# Patient Record
Sex: Male | Born: 1950 | Race: White | Hispanic: No | Smoking: Current every day smoker
Health system: Southern US, Community
[De-identification: ages and names within clinical notes are randomized; demographics above are authoritative.]

## PROBLEM LIST (undated history)

## (undated) DIAGNOSIS — I10 Essential (primary) hypertension: Secondary | ICD-10-CM

## (undated) DIAGNOSIS — E785 Hyperlipidemia, unspecified: Secondary | ICD-10-CM

## (undated) HISTORY — PX: APPENDECTOMY: SHX54

## (undated) HISTORY — DX: Hyperlipidemia, unspecified: E78.5

## (undated) HISTORY — DX: Essential (primary) hypertension: I10

---

## 1999-12-19 ENCOUNTER — Encounter: Admission: RE | Admit: 1999-12-19 | Discharge: 1999-12-19 | Payer: Self-pay

## 1999-12-23 ENCOUNTER — Ambulatory Visit (HOSPITAL_BASED_OUTPATIENT_CLINIC_OR_DEPARTMENT_OTHER): Admission: RE | Admit: 1999-12-23 | Discharge: 1999-12-23 | Payer: Self-pay

## 2000-01-20 ENCOUNTER — Ambulatory Visit (HOSPITAL_BASED_OUTPATIENT_CLINIC_OR_DEPARTMENT_OTHER): Admission: RE | Admit: 2000-01-20 | Discharge: 2000-01-20 | Payer: Self-pay

## 2006-01-18 ENCOUNTER — Ambulatory Visit: Payer: Self-pay

## 2020-07-03 ENCOUNTER — Encounter: Payer: Self-pay | Admitting: Sports Medicine

## 2020-07-03 ENCOUNTER — Ambulatory Visit (INDEPENDENT_AMBULATORY_CARE_PROVIDER_SITE_OTHER): Payer: Medicare Other

## 2020-07-03 ENCOUNTER — Ambulatory Visit (INDEPENDENT_AMBULATORY_CARE_PROVIDER_SITE_OTHER): Payer: Medicare Other | Admitting: Sports Medicine

## 2020-07-03 DIAGNOSIS — M5416 Radiculopathy, lumbar region: Secondary | ICD-10-CM

## 2020-07-03 MED ORDER — TRAMADOL HCL 50 MG PO TABS: 50.0000 mg | ORAL_TABLET | Freq: Three times a day (TID) | ORAL | 0 refills | Status: AC | PRN

## 2020-07-03 MED ORDER — MELOXICAM 15 MG PO TABS: ORAL_TABLET | ORAL | 3 refills | Status: DC

## 2020-07-03 NOTE — Assessment & Plan Note (Signed)
This is a very pleasant 69 year old male, for some time now he has had pain running from his left buttock, lateral knee, lateral lower leg, but not to the foot. Worse with sitting, flexion, Valsalva, playing golf. He has no red flag symptoms. We will start relatively conservatively, x-rays, meloxicam, tramadol to use before golf, formal physical therapy. We did discussed the evolutionary anthropology of lumbar disc disease. Return to see me in 6 weeks, MRI for interventional planning if no better.

## 2020-07-03 NOTE — Progress Notes (Signed)
    Procedures performed today:    None.  Independent interpretation of notes and tests performed by another provider:   None.  Brief History, Exam, Impression, and Recommendations:    Left lumbar radiculitis This is a very pleasant 69 year old male, for some time now he has had pain running from his left buttock, lateral knee, lateral lower leg, but not to the foot. Worse with sitting, flexion, Valsalva, playing golf. He has no red flag symptoms. We will start relatively conservatively, x-rays, meloxicam, tramadol to use before golf, formal physical therapy. We did discussed the evolutionary anthropology of lumbar disc disease. Return to see me in 6 weeks, MRI for interventional planning if no better.    ___________________________________________ Ihor Austin. Benjamin Stain, M.D., ABFM., CAQSM. Primary Care and Sports Medicine [facility]  Adjunct Instructor of Family Medicine  [employer]

## 2020-07-08 ENCOUNTER — Ambulatory Visit (INDEPENDENT_AMBULATORY_CARE_PROVIDER_SITE_OTHER): Payer: Medicare Other | Admitting: Rehabilitative and Restorative Service Providers"

## 2020-07-08 ENCOUNTER — Encounter: Payer: Self-pay | Admitting: Rehabilitative and Restorative Service Providers"

## 2020-07-08 ENCOUNTER — Other Ambulatory Visit: Payer: Self-pay

## 2020-07-08 DIAGNOSIS — M79605 Pain in left leg: Secondary | ICD-10-CM | POA: Diagnosis not present

## 2020-07-08 DIAGNOSIS — M5416 Radiculopathy, lumbar region: Secondary | ICD-10-CM

## 2020-07-08 DIAGNOSIS — R29898 Other symptoms and signs involving the musculoskeletal system: Secondary | ICD-10-CM

## 2020-07-08 NOTE — Therapy (Signed)
[facility] [address] [place], [place], [address] Phone: [phone]   Fax:  [phone]  Physical Therapy Evaluation  Patient Details  Name: Leonard Anderson MRN: [id number] Date of Birth: 11/25/1951 Referring Provider (PT): Dr Benjamin Stain    Encounter Date: 07/08/2020   PT End of Session - 07/08/20 1249    Visit Number 1    Number of Visits 6    Date for PT Re-Evaluation 08/19/20    PT Start Time 1146    PT Stop Time 1238    PT Time Calculation (min) 52 min    Activity Tolerance Patient tolerated treatment well           Past Medical History:  Diagnosis Date  . Hyperlipidemia   . Hypertension     Past Surgical History:  Procedure Laterality Date  . APPENDECTOMY      There were no vitals filed for this visit.    Subjective Assessment - 07/08/20 1154    Subjective Patient reports that he started having intermittent LBP/primarily Lt LE pain March 2021 with continued pain in the Lt LE limiting activities. Initial onset of LBP at 69 yo with some intermittent pain in the LB 1-2 times a year which resolved with rest and sometimes MD appt for muscle relaxants. Has always worked out and stayed active which has helped manage pain. He has changed activity level over the past year with COVID.    Pertinent History HTN; intermittent LBP    Patient Stated Goals golf; help daughter with business woodworking with some lifting    Currently in Pain? No/denies              [facility] PT Assessment - 07/08/20 0001      Assessment   Medical Diagnosis LBP with Lt LE radiculopathy     Referring Provider (PT) Dr Benjamin Stain     Onset Date/Surgical Date 02/26/20    Hand Dominance Right    Next MD Visit 08/14/20    Prior Therapy none       Precautions   Precautions None      Balance Screen   Has the patient fallen in the past 6 months No    Has the patient had a decrease in activity level because of a fear of falling?  No     Is the patient reluctant to leave their home because of a fear of falling?  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 some lifting up to 40-50 #; sometimes awkward lifts     Leisure houshohold chores; golfing; exercise at home ~ 2-3 times/wk cardio and resistive bands (gym has not reopened)        Observation/Other Assessments   Focus on Therapeutic Outcomes (FOTO)  37% limitation       Sensation   Additional Comments WFL's per pt report       Posture/Postural Control   Posture Comments head forwar; rounded posture; sits with trunk forward flexed      AROM   Lumbar Flexion 80%     Lumbar Extension 35% tightness     Lumbar - Right Side Bend 90%    Lumbar - Left Side Bend 85% pulling Lt lumbar     Lumbar - Right Rotation 40%    Lumbar - Left Rotation 35% tightness  Lt lumbar area       Strength   Overall Strength Comments WFL's bilat LE's       Flexibility   Hamstrings tight Lt > Rt     Quadriceps tight Rt > Lt     ITB tight Lt > Rt     Piriformis tight Rt > Lt       Palpation   Spinal mobility hypomobile lumbar spine with CPA mobs     Palpation comment muscular tightness Lt lumbar > Rt; Rt posterior hip       Special Tests   Other special tests (-) slump test; SLR; FABERS                       Objective measurements completed on examination: See above findings.       [facility] Adult PT Treatment/Exercise - 07/08/20 0001      Self-Care   Self-Care --   discussion/demo sitting postures     Lumbar Exercises: Stretches   Passive Hamstring Stretch Left;2 reps;30 seconds    Standing Extension 1 rep   2-3 sec    Prone on Elbows Stretch 1 rep;60 seconds    Press Ups 10 reps   2-3 sec - limited mobility    Gastroc Stretch Left;2 reps;30 seconds    Gastroc Stretch Limitations soleus stretch 30 sec x 2 reps                    PT Education - 07/08/20 1235    Education Details HEP posture and back care POC    Person(s) Educated Patient    Methods Explanation;Demonstration;Tactile cues;Verbal cues;Handout    Comprehension Verbalized understanding;Returned demonstration;Verbal cues required;Tactile cues required               PT Long Term Goals - 07/08/20 1300      PT LONG TERM GOAL #1   Title Increase spinal mobility with patient to demonstrate 50-60% turnk extension in prone    Time 6    Period Weeks    Status New    Target Date 08/19/20      PT LONG TERM GOAL #2   Title Patient will verbalize and demonstrate proper body mechanics with sitting; transfers; lifting    Time 6    Period Weeks    Status New    Target Date 08/19/20      PT LONG TERM GOAL #3   Title Patient reports resolution of all Lt LE pain with functional and recreational activities    Time 6    Period Weeks    Status New    Target Date 08/19/20      PT LONG TERM GOAL #4   Title Independent in HEP    Time 6    Period Weeks    Status New    Target Date 08/19/20      PT LONG TERM GOAL #5   Title Improve FOTO to </= 28% limitation    Time 6    Period Weeks    Status New                  Plan - 07/08/20 1249    Clinical Impression Statement Patient presents with history of intermittent Lt LE pain for the past 5-6 months. He has a history of episodic LBP over the past 50 years with symptoms resolving with rest and meds. Patient has limited trunk mobility and muscular tightness through the lumbar and  posterior hip; forward flexed posture and alignment; intermittent pain; limited functional and recreational tolerance. Patient will benefit from PT to address problems identified and return patient to prior level of function with return to regular exercise.    Stability/Clinical Decision Making Stable/Uncomplicated    Clinical Decision Making Low    Rehab Potential Good    PT Frequency 1x / week     PT Duration 6 weeks    PT Treatment/Interventions Patient/family education;ADLs/Self Care Home Management;Aquatic Therapy;Cryotherapy;Electrical Stimulation;Iontophoresis 4mg /ml Dexamethasone;Moist Heat;Traction;Ultrasound;Functional mobility training;Therapeutic activities;Therapeutic exercise;Balance training;Neuromuscular re-education;Manual techniques;Dry needling;Taping    PT Next Visit Plan review HEP; continue lumbar extension;  progress with pec stretch; posterior shoulder girdle TB strengthening including antirotation TB exercise; begin lifting program; education re - return to gym program    PT Home Exercise Plan [email or web address]    Consulted and Agree with Plan of Care Patient           Patient will benefit from skilled therapeutic intervention in order to improve the following deficits and impairments:  Decreased range of motion, Decreased mobility, Postural dysfunction, Pain, Decreased activity tolerance  Visit Diagnosis: Radiculopathy, lumbar region - Plan: PT plan of care cert/re-cert  Pain of left lower extremity - Plan: PT plan of care cert/re-cert  Other symptoms and signs involving the musculoskeletal system - Plan: PT plan of care cert/re-cert     Problem List Patient Active Problem List   Diagnosis Date Noted  . Left lumbar radiculitis 07/03/2020    Ruvim Risko 09/03/2020 PT, MPH  07/08/2020, 1:05 PM  [facility] [address] [place] [address] [place], [place], [place] Phone: [phone]   Fax:  [phone]  Name: Leonard Anderson MRN: Silva Bandy Date of Birth: April 05, 1951

## 2020-07-08 NOTE — Patient Instructions (Signed)
Access Code: [email or web address]: [email or web address]: 07/12/2021Prepared by: Domino Holten HoltExercises  Prone Press Up - 2 x daily - 7 x weekly - 1 sets - 10 reps - 2-3 sec hold  Prone Press Up on Elbows - 2 x daily - 7 x weekly - 1 sets - 3 reps - 1-2 min hold  Hooklying Hamstring Stretch with Strap - 2 x daily - 7 x weekly - 1 sets - 3 reps - 30 sec hold  Supine ITB Stretch with Strap - 2 x daily - 7 x weekly - 1 sets - 3 reps - 30 sec hold  Gastroc Stretch on Wall - 2 x daily - 7 x weekly - 1 sets - 3 reps - 30 sec hold  Soleus Stretch on Wall - 2 x daily - 7 x weekly - 1 sets - 3 reps - 30 sec hold Patient Education  Hospital doctor

## 2020-07-17 ENCOUNTER — Ambulatory Visit: Payer: Medicare Other | Admitting: Rehabilitative and Restorative Service Providers"

## 2020-07-17 ENCOUNTER — Other Ambulatory Visit: Payer: Self-pay

## 2020-07-17 ENCOUNTER — Encounter: Payer: Self-pay | Admitting: Rehabilitative and Restorative Service Providers"

## 2020-07-17 DIAGNOSIS — M79605 Pain in left leg: Secondary | ICD-10-CM

## 2020-07-17 DIAGNOSIS — R29898 Other symptoms and signs involving the musculoskeletal system: Secondary | ICD-10-CM

## 2020-07-17 DIAGNOSIS — M5416 Radiculopathy, lumbar region: Secondary | ICD-10-CM | POA: Diagnosis not present

## 2020-07-17 NOTE — Patient Instructions (Addendum)
Access Code: [email or web address]: [email or web address]: 07/21/2021Prepared by: Jenna Ardoin HoltExercises  Prone Press Up - 2 x daily - 7 x weekly - 1 sets - 10 reps - 2-3 sec hold  Prone Press Up on Elbows - 2 x daily - 7 x weekly - 1 sets - 3 reps - 1-2 min hold  Hooklying Hamstring Stretch with Strap - 2 x daily - 7 x weekly - 1 sets - 3 reps - 30 sec hold  Supine ITB Stretch with Strap - 2 x daily - 7 x weekly - 1 sets - 3 reps - 30 sec hold  Gastroc Stretch on Wall - 2 x daily - 7 x weekly - 1 sets - 3 reps - 30 sec hold  Soleus Stretch on Wall - 2 x daily - 7 x weekly - 1 sets - 3 reps - 30 sec hold  Seated Hamstring Stretch - 2 x daily - 7 x weekly - 1 sets - 3 reps - 30 sec hold  Doorway Pec Stretch at 60 Degrees Abduction - 3 x daily - 7 x weekly - 3 reps - 1 sets  Doorway Pec Stretch at 90 Degrees Abduction - 3 x daily - 7 x weekly - 3 reps - 1 sets - 30 seconds hold  Doorway Pec Stretch at 120 Degrees Abduction - 3 x daily - 7 x weekly - 3 reps - 1 sets - 30 second hold hold  Standing Bilateral Low Shoulder Row with Anchored Resistance - 2 x daily - 7 x weekly - 1-3 sets - 10 reps - 2-3 sec hold  Standing Row with Resistance with Anchored Resistance at Chest Height Palms Down - 2 x daily - 7 x weekly - 1-3 sets - 10 reps - 2-3 sec hold  Shoulder extension with resistance - Neutral - 2 x daily - 7 x weekly - 1-3 sets - 10 reps - 2-3 sec hold  Anti-Rotation Lateral Stepping with Press - 2 x daily - 7 x weekly - 1-3 sets - 10 reps - 5 sec hold  Mini Squat - 2 x daily - 7 x weekly - 1-3 sets - 10 reps - 5 sec hold     Sleeping on Back  Place pillow under knees. A pillow with cervical support and a roll around waist are also helpful. Copyright  VHI. All rights reserved.  Sleeping on Side Place pillow between knees. Use cervical support under neck and a roll around waist as needed. Copyright  VHI. All rights reserved.   Sleeping on Stomach   If this is the only desirable  sleeping position, place pillow under lower legs, and under stomach or chest as needed.  Posture - Sitting   Sit upright, head facing forward. Try using a roll to support lower back. Keep shoulders relaxed, and avoid rounded back. Keep hips level with knees. Avoid crossing legs for long periods. Stand to Sit / Sit to Stand   To sit: Bend knees to lower self onto front edge of chair, then scoot back on seat. To stand: Reverse sequence by placing one foot forward, and scoot to front of seat. Use rocking motion to stand up.   Work Height and Reach  Ideal work height is no more than 2 to 4 inches below elbow level when standing, and at elbow level when sitting. Reaching should be limited to arm's length, with elbows slightly bent.  Bending  Bend at hips and knees, not back. Keep feet shoulder-width apart.    Posture -  Standing   Good posture is important. Avoid slouching and forward head thrust. Maintain curve in low back and align ears over shoul- ders, hips over ankles.  Alternating Positions   Alternate tasks and change positions frequently to reduce fatigue and muscle tension. Take rest breaks. Computer Work   Position work to Art gallery manager. Use proper work and seat height. Keep shoulders back and down, wrists straight, and elbows at right angles. Use chair that provides full back support. Add footrest and lumbar roll as needed.  Getting Into / Out of Car  Lower self onto seat, scoot back, then bring in one leg at a time. Reverse sequence to get out.  Dressing  Lie on back to pull socks or slacks over feet, or sit and bend leg while keeping back straight.    Housework - Sink  Place one foot on ledge of cabinet under sink when standing at sink for prolonged periods.   Pushing / Pulling  Pushing is preferable to pulling. Keep back in proper alignment, and use leg muscles to do the work.  Deep Squat   Squat and lift with both arms held against upper trunk. Tighten  stomach muscles without holding breath. Use smooth movements to avoid jerking.  Avoid Twisting   Avoid twisting or bending back. Pivot around using foot movements, and bend at knees if needed when reaching for articles.  Carrying Luggage   Distribute weight evenly on both sides. Use a cart whenever possible. Do not twist trunk. Move body as a unit.   Lifting Principles .Maintain proper posture and head alignment. .Slide object as close as possible before lifting. .Move obstacles out of the way. .Test before lifting; ask for help if too heavy. .Tighten stomach muscles without holding breath. .Use smooth movements; do not jerk. .Use legs to do the work, and pivot with feet. .Distribute the work load symmetrically and close to the center of trunk. .Push instead of pull whenever possible.   Ask For Help   Ask for help and delegate to others when possible. Coordinate your movements when lifting together, and maintain the low back curve.  Log Roll   Lying on back, bend left knee and place left arm across chest. Roll all in one movement to the right. Reverse to roll to the left. Always move as one unit. Housework - Sweeping  Use long-handled equipment to avoid stooping.   Housework - Wiping  Position yourself as close as possible to reach work surface. Avoid straining your back.  Laundry - Unloading Wash   To unload small items at bottom of washer, lift leg opposite to arm being used to reach.  Gardening - Raking  Move close to area to be raked. Use arm movements to do the work. Keep back straight and avoid twisting.     Cart  When reaching into cart with one arm, lift opposite leg to keep back straight.   Getting Into / Out of Bed  Lower self to lie down on one side by raising legs and lowering head at the same time. Use arms to assist moving without twisting. Bend both knees to roll onto back if desired. To sit up, start from lying on side, and use same move-ments  in reverse. Housework - Vacuuming  Hold the vacuum with arm held at side. Step back and forth to move it, keeping head up. Avoid twisting.   Laundry - Armed forces training and education officer so that bending and twisting can be avoided.  Laundry - Unloading Dryer  Squat down to reach into clothes dryer or use a reacher.  Gardening - Weeding / Psychiatric nurse or Kneel. Knee pads may be helpful.

## 2020-07-17 NOTE — Therapy (Addendum)
[facility] [place], [place], [address] Phone: [phone]   Fax:  [phone]  Physical Therapy Treatment  Patient Details  Name: ALOYS HUPFER MRN: [id number] Date of Birth: 01/06/1951 Referring Provider (PT): Dr Dianah Field    Encounter Date: 07/17/2020   PT End of Session - 07/17/20 0759    Visit Number 2    Number of Visits 6    Date for PT Re-Evaluation 08/19/20    PT Start Time 0758    PT Stop Time 0846    PT Time Calculation (min) 48 min    Activity Tolerance Patient tolerated treatment well           Past Medical History:  Diagnosis Date  . Hyperlipidemia   . Hypertension     Past Surgical History:  Procedure Laterality Date  . APPENDECTOMY      There were no vitals filed for this visit.   Subjective Assessment - 07/17/20 0800    Subjective Initially worst with exercise for a couple of days. Has gradually improved since then. He hit golf balls for an hour and a half and had severe pain in his Lt calf whlie hitting golf balls. Pain subsides with time.    Currently in Pain? Yes    Pain Score 1     Pain Location Calf    Pain Orientation Left    Pain Descriptors / Indicators Nagging    Pain Type Chronic pain    Pain Onset More than a month ago    Pain Frequency Intermittent    Aggravating Factors  hitting golf balls; lifting    Pain Relieving Factors time; changing positions                             [facility] Adult PT Treatment/Exercise - 07/17/20 0001      Lumbar Exercises: Stretches   Passive Hamstring Stretch Left;2 reps;30 seconds    Standing Extension 1 rep   2-3 sec    Prone on Elbows Stretch 1 rep;60 seconds    Press Ups 10 reps   2-3 sec - limited mobility    ITB Stretch Left;2 reps;30 seconds   supine with strap   Gastroc Stretch Left;2 reps;30 seconds    Gastroc Stretch Limitations soleus stretch 30 sec x 2 reps       Lumbar Exercises: Standing    Other Standing Lumbar Exercises lifting with good technique x 5 reps       Lumbar Exercises: Seated   Other Seated Lumbar Exercises arch and sag x 10       Shoulder Exercises: Standing   Extension Strengthening;Both;10 reps;Theraband    Theraband Level (Shoulder Extension) Level 4 (Blue)    Row Strengthening;Both;10 reps;Theraband    Theraband Level (Shoulder Row) Level 4 (Blue)    Row Limitations bow and arrow blue x 10 each side     Other Standing Exercises antirotation step out; press out and pull back x 10 reps each side       Shoulder Exercises: Stretch   Other Shoulder Stretches doorway 3 positions 30 sec hold x 2 reps                   PT Education - 07/17/20 0819    Education Details HEP    Person(s) Educated Patient    Methods Explanation;Demonstration;Tactile cues;Verbal cues;Handout    Comprehension Verbalized understanding;Returned demonstration;Verbal cues required;Tactile cues required  PT Long Term Goals - 07/08/20 1300      PT LONG TERM GOAL #1   Title Increase spinal mobility with patient to demonstrate 50-60% turnk extension in prone    Time 6    Period Weeks    Status New    Target Date 08/19/20      PT LONG TERM GOAL #2   Title Patient will verbalize and demonstrate proper body mechanics with sitting; transfers; lifting    Time 6    Period Weeks    Status New    Target Date 08/19/20      PT LONG TERM GOAL #3   Title Patient reports resolution of all Lt LE pain with functional and recreational activities    Time 6    Period Weeks    Status New    Target Date 08/19/20      PT LONG TERM GOAL #4   Title Independent in HEP    Time 6    Period Weeks    Status New    Target Date 08/19/20      PT LONG TERM GOAL #5   Title Improve FOTO to </= 28% limitation    Time 6    Period Weeks    Status New                 Plan - 07/17/20 0805    Clinical Impression Statement Patient continues to experience pain with  lifting and golfing. Reviewed and corrected exercises. Added core stabilization and strengthening exercises with resistive bands and body weight.    Rehab Potential Good    PT Frequency 1x / week    PT Duration 6 weeks    PT Treatment/Interventions Patient/family education;ADLs/Self Care Home Management;Aquatic Therapy;Cryotherapy;Electrical Stimulation;Iontophoresis 45m/ml Dexamethasone;Moist Heat;Traction;Ultrasound;Functional mobility training;Therapeutic activities;Therapeutic exercise;Balance training;Neuromuscular re-education;Manual techniques;Dry needling;Taping    PT Next Visit Plan review HEP; continue lumbar extension;  progress with pec stretch; posterior shoulder girdle TB strengthening including antirotation TB exercise; begin lifting program; education re - return to gym program. Patient will benefit from progression of resistive exercises and work on segmental spinal mobility.    PT Home Exercise Plan [email or web address]    Consulted and Agree with Plan of Care Patient           Patient will benefit from skilled therapeutic intervention in order to improve the following deficits and impairments:     Visit Diagnosis: Radiculopathy, lumbar region  Pain of left lower extremity  Other symptoms and signs involving the musculoskeletal system     Problem List Patient Active Problem List   Diagnosis Date Noted  . Left lumbar radiculitis 07/03/2020    Maleya Leever PNilda SimmerPT, MPH  07/17/2020, 8:52 AM  [facility] [address] [address]Phone: [phone]  Fax:  [phone] Name: BRAJOHN HENERYMRN: [id number] of Birth: 310/01/1951 PHYSICAL THERAPY DISCHARGE SUMMARY  Visits from Start of Care: 2  Current functional level related to goals / functional outcomes: See last progress note for discharge status    Remaining deficits: Unknown     Education / Equipment: HEP  Plan:                                                     Patient goals were not met.  Patient is being discharged due to not returning since the last visit.  ?????     Jirah Rider P. Helene Kelp PT, MPH 09/26/20 10:43 AM

## 2020-08-01 ENCOUNTER — Encounter: Payer: Medicare Other | Admitting: Rehabilitative and Restorative Service Providers"

## 2020-08-14 ENCOUNTER — Ambulatory Visit (INDEPENDENT_AMBULATORY_CARE_PROVIDER_SITE_OTHER): Payer: Medicare Other | Admitting: Sports Medicine

## 2020-08-14 ENCOUNTER — Other Ambulatory Visit: Payer: Self-pay

## 2020-08-14 DIAGNOSIS — M5416 Radiculopathy, lumbar region: Secondary | ICD-10-CM

## 2020-08-14 NOTE — Assessment & Plan Note (Addendum)
This is a very pleasant 69 year old male, for a long time now he is had pain in running of his left buttock, lateral knee, lateral lower leg but not to the foot, consistently worse with sitting, flexion, Valsalva, playing golf and with right rotation. No other red flag symptoms, x-ray showed widespread degenerative changes, we have done physical therapy, x-rays, meloxicam, tramadol for golf, nothing is helping. At the last visit we discussed the evolutionary anthropology of lumbar disc disease, at this point we are going to proceed with MRI for epidural planning. He is okay with me simply ordering the epidural rather than having an MRI follow-up visit.  I will see him 1 month after the epidural injection.  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  I think we should certainly start with a left L5-S1 transforaminal epidural and potentially a left L4-L5 facet injection.  I do not think this facet cyst is going to be accessible via needle.

## 2020-08-14 NOTE — Progress Notes (Addendum)
    Procedures performed today:    None.  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  There is also a disc protrusion likely affecting the L5 nerve root on the left.  Brief History, Exam, Impression, and Recommendations:    Left lumbar radiculitis This is a very pleasant 69 year old male, for a long time now he is had pain in running of his left buttock, lateral knee, lateral lower leg but not to the foot, consistently worse with sitting, flexion, Valsalva, playing golf and with right rotation. No other red flag symptoms, x-ray showed widespread degenerative changes, we have done physical therapy, x-rays, meloxicam, tramadol for golf, nothing is helping. At the last visit we discussed the evolutionary anthropology of lumbar disc disease, at this point we are going to proceed with MRI for epidural planning. He is okay with me simply ordering the epidural rather than having an MRI follow-up visit.  I will see him 1 month after the epidural injection.  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  I think we should certainly start with a left L5-S1 transforaminal epidural and potentially a left L4-L5 facet injection.  I do not think this facet cyst is going to be accessible via needle.    ___________________________________________ Ihor Austin. Benjamin Stain, M.D., ABFM., CAQSM. Primary Care and Sports Medicine [facility]  Adjunct Instructor of Family Medicine  [employer]

## 2020-08-18 ENCOUNTER — Other Ambulatory Visit: Payer: Self-pay

## 2020-08-18 ENCOUNTER — Ambulatory Visit (INDEPENDENT_AMBULATORY_CARE_PROVIDER_SITE_OTHER): Payer: Medicare Other

## 2020-08-18 DIAGNOSIS — M5416 Radiculopathy, lumbar region: Secondary | ICD-10-CM

## 2020-08-19 NOTE — Addendum Note (Signed)
Addended by: Monica Becton on: 08/19/2020 08:35 AM   Modules accepted: Orders, Level of Service

## 2020-10-22 ENCOUNTER — Other Ambulatory Visit: Payer: Self-pay | Admitting: Sports Medicine

## 2020-10-22 DIAGNOSIS — M5416 Radiculopathy, lumbar region: Secondary | ICD-10-CM

## 2021-02-23 ENCOUNTER — Other Ambulatory Visit: Payer: Self-pay | Admitting: Sports Medicine

## 2021-02-23 DIAGNOSIS — M5416 Radiculopathy, lumbar region: Secondary | ICD-10-CM

## 2021-05-29 ENCOUNTER — Other Ambulatory Visit: Payer: Self-pay | Admitting: Sports Medicine

## 2021-05-29 DIAGNOSIS — M5416 Radiculopathy, lumbar region: Secondary | ICD-10-CM

## 2021-11-23 ENCOUNTER — Other Ambulatory Visit: Payer: Self-pay | Admitting: Sports Medicine

## 2021-11-23 DIAGNOSIS — M5416 Radiculopathy, lumbar region: Secondary | ICD-10-CM

## 2022-01-24 IMAGING — MR MR LUMBAR SPINE W/O CM
4 of 5 series · 26 of 48 positions shown · non-contrast
Comparison: Radiography 07/03/2020

CLINICAL DATA: Left lumbar radicular pain.

EXAM:
MRI LUMBAR SPINE WITHOUT CONTRAST
TECHNIQUE: Multiplanar, multisequence MR imaging of the lumbar spine was
performed. No intravenous contrast was administered.

[Series 2: T2 · sagittal · 4.0mm · 0.81mm/px · 6 of 15 slices shown (1 of 2)]
[im 1/15]
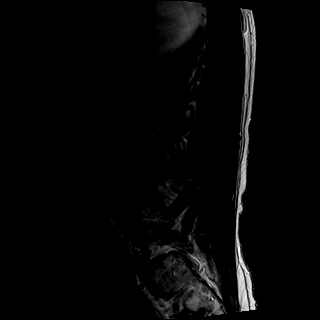
[im 3/15]
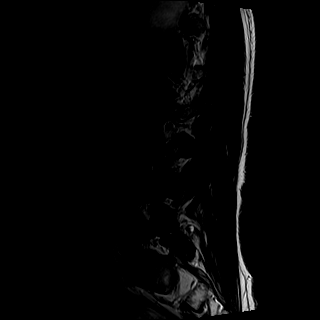
[im 6/15]
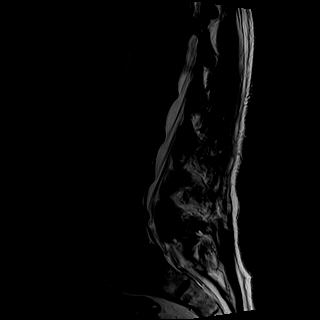
[im 9/15]
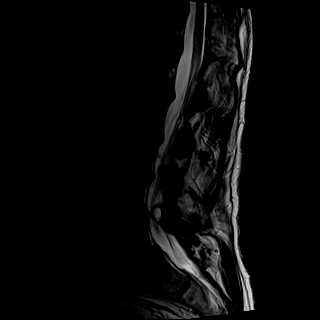
[im 12/15]
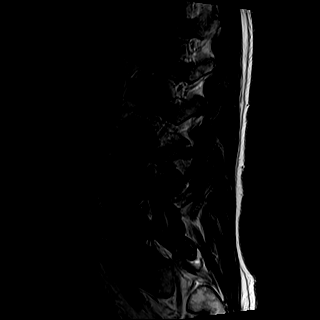
[im 15/15]
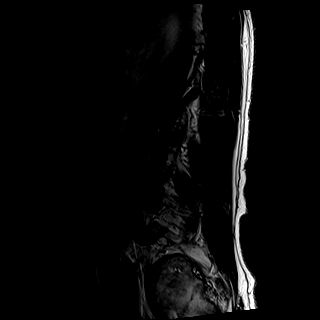

[Series 3: T1 · sagittal · 4.0mm · 0.41mm/px · 6 of 15 slices shown (1 of 2)]
[im 1/15]
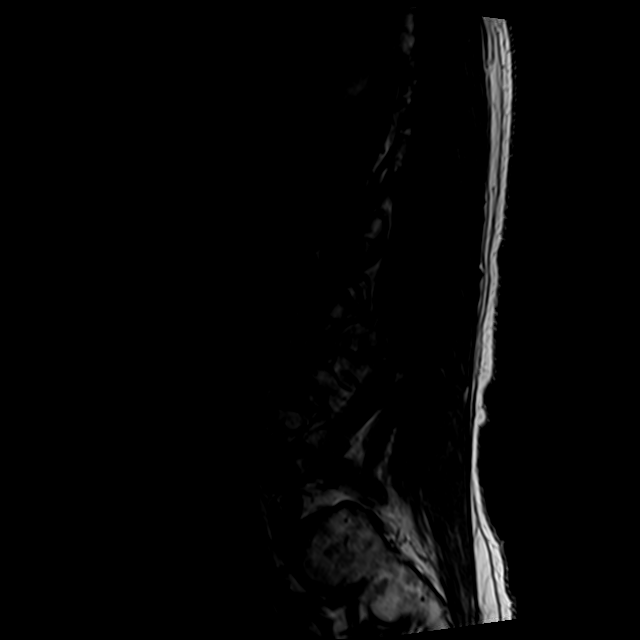
[im 3/15]
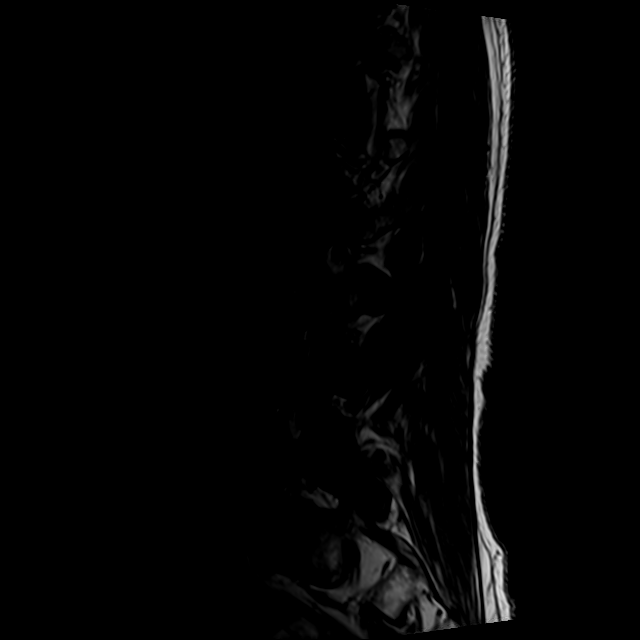
[im 6/15]
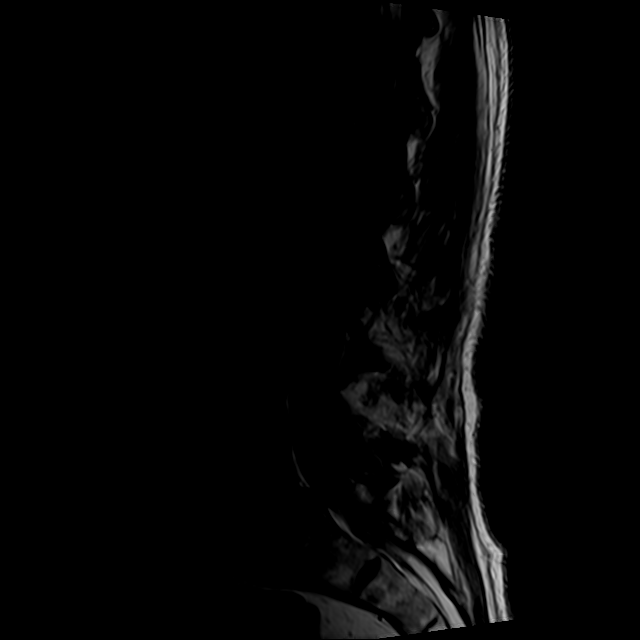
[im 9/15]
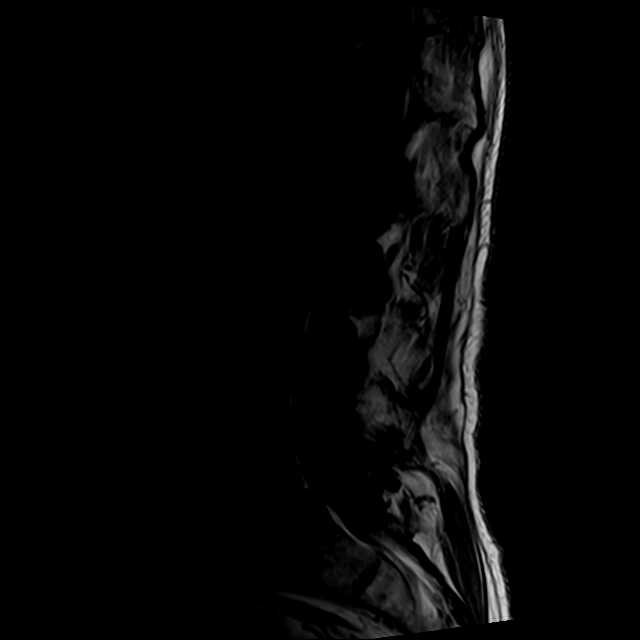
[im 12/15]
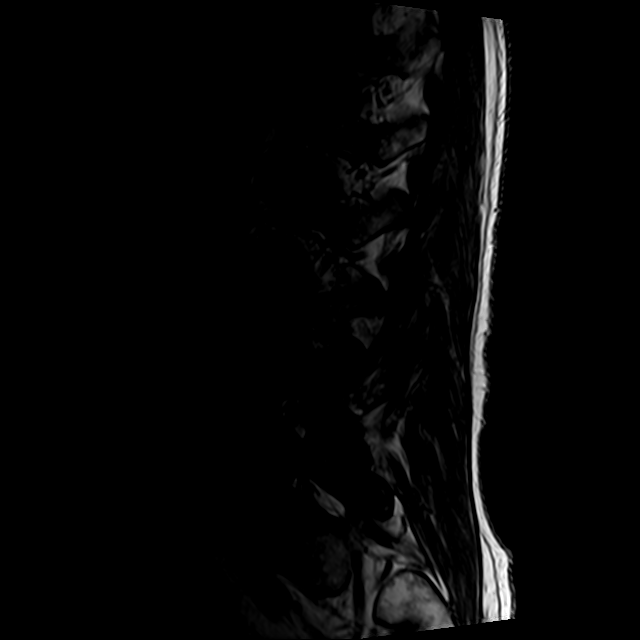
[im 15/15]
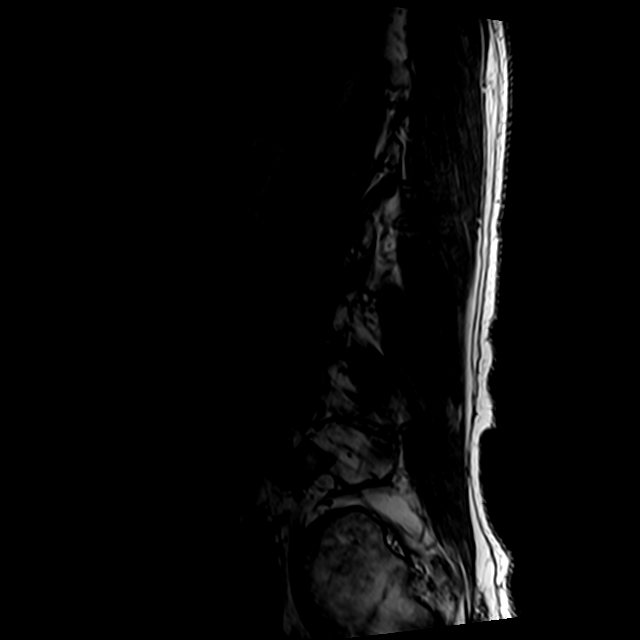

[Series 5: T2 · axial · 4.0mm · 0.78mm/px · z∈[-127,+93]mm · 9 of 41 slices shown (2 of 2)]
[im 1/41]
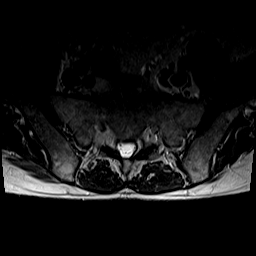
[im 6/41]
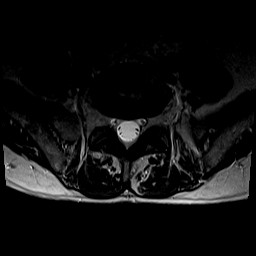
[im 12/41]
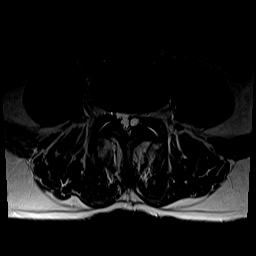
[im 18/41]
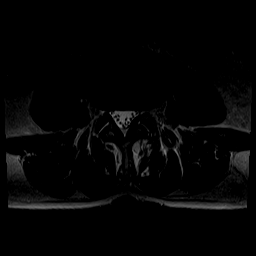
[im 21/41]
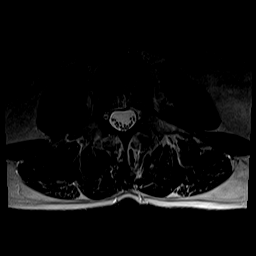
[im 23/41]
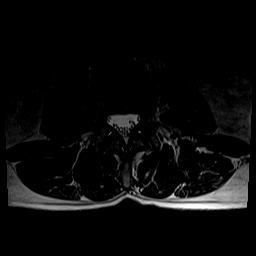
[im 29/41]
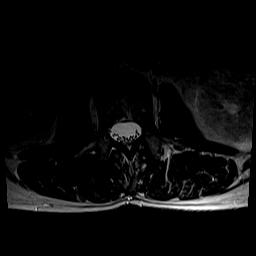
[im 35/41]
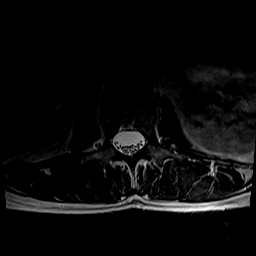
[im 41/41]
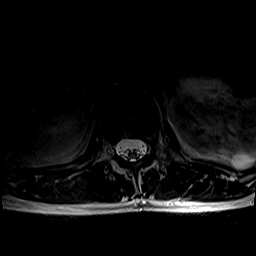

[Series 6: T1 · axial · 4.0mm · 0.39mm/px · z∈[-127,+63]mm · 5 of 41 slices shown (2 of 2)]
[im 1/41]
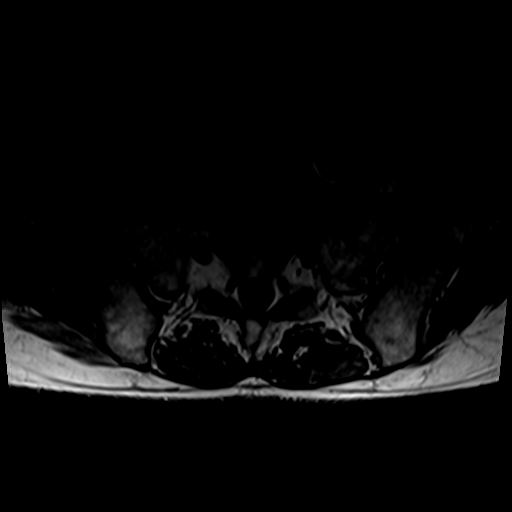
[im 6/41]
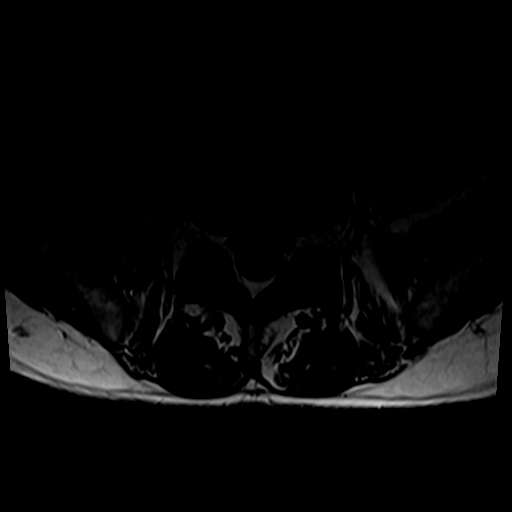
[im 12/41]
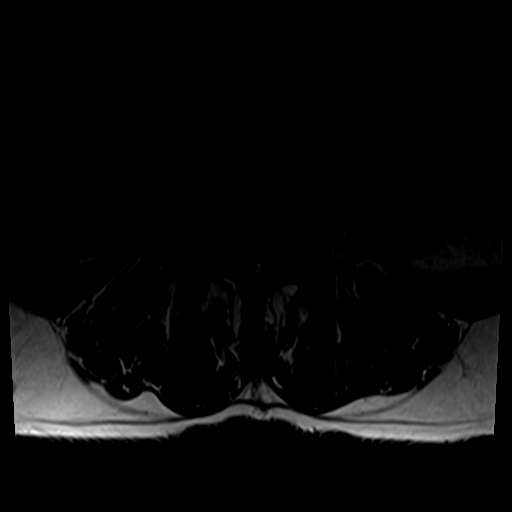
[im 21/41]
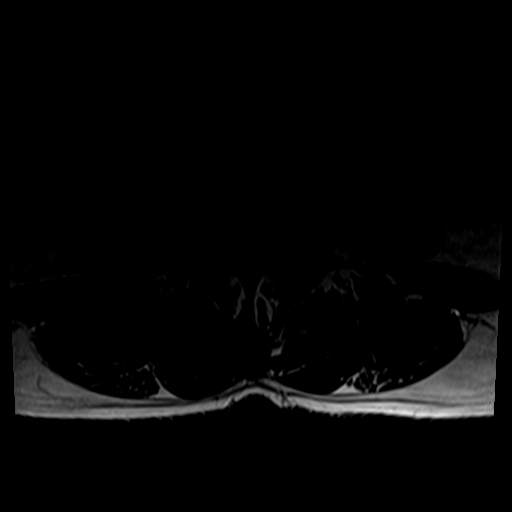
[im 35/41]
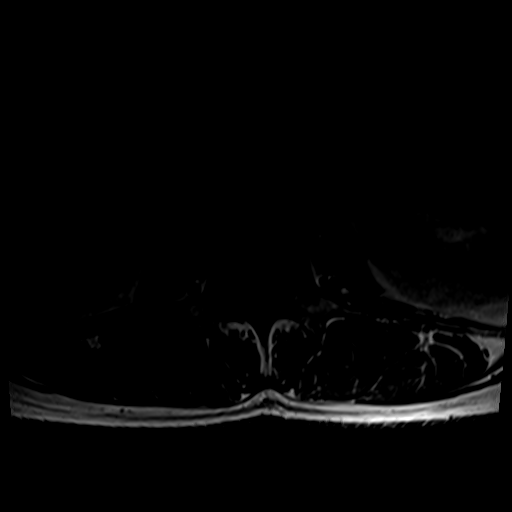

[26 of 48 positions shown; findings below may reference images not displayed]

FINDINGS: Segmentation:  5 lumbar type vertebral bodies.

Alignment:  1 or 2 mm anterolisthesis L5-S1.

Vertebrae: No vertebral body fracture or focal lesion. Chronic
bilateral pars defects at L5.

Conus medullaris and cauda equina: Conus extends to the T12-L1
level. Conus and cauda equina appear normal.

Paraspinal and other soft tissues: Negative

Disc levels:

T12-L1: Normal

L1-2: Mild bulging of the disc.  No compressive stenosis.

L2-3: Mild bulging of the disc.  No compressive stenosis.

L3-4: Mild bulging of the disc. Mild facet hypertrophy. No
compressive stenosis.

L4-5: Mild bulging of the disc. Synovial cyst projecting inward from
the facet on the left measuring up to 12 mm in diameter. Stenosis of
the left lateral recess that could compress the left L5 nerve.

L5-S1: Chronic bilateral pars defects with 1 or 2 mm of
anterolisthesis. Mild bulging of the disc. No compressive canal or
foraminal stenosis. Pars defect/facet arthropathy could contribute
to low back pain.
IMPRESSION: At L4-5, there is a synovial cyst projecting inward from the facet
on the left measuring up to 12 mm. Also at this level there is
bulging of the disc. Left lateral recess stenosis could focally
compress the left L5 nerve.

L5-S1: Chronic bilateral pars defects and facet degeneration. One or
2 mm of anterolisthesis. No neural compressive stenosis. Pars
defect/facet arthropathy could contribute to low back pain or
referred pain syndromes.

Non-compressive disc bulges at L1-2, L2-3 and L3-4.
